# Patient Record
Sex: Male | Born: 1960 | Hispanic: No | Marital: Married | Smoking: Never smoker
Health system: Southern US, Community
[De-identification: ages and names within clinical notes are randomized; demographics above are authoritative.]

## PROBLEM LIST (undated history)

## (undated) DIAGNOSIS — D126 Benign neoplasm of colon, unspecified: Secondary | ICD-10-CM

## (undated) DIAGNOSIS — I10 Essential (primary) hypertension: Secondary | ICD-10-CM

## (undated) DIAGNOSIS — E785 Hyperlipidemia, unspecified: Secondary | ICD-10-CM

## (undated) DIAGNOSIS — T7840XA Allergy, unspecified, initial encounter: Secondary | ICD-10-CM

## (undated) DIAGNOSIS — K649 Unspecified hemorrhoids: Secondary | ICD-10-CM

## (undated) DIAGNOSIS — M109 Gout, unspecified: Secondary | ICD-10-CM

## (undated) HISTORY — DX: Gout, unspecified: M10.9

## (undated) HISTORY — DX: Hyperlipidemia, unspecified: E78.5

## (undated) HISTORY — DX: Benign neoplasm of colon, unspecified: D12.6

## (undated) HISTORY — DX: Unspecified hemorrhoids: K64.9

## (undated) HISTORY — DX: Essential (primary) hypertension: I10

## (undated) HISTORY — DX: Allergy, unspecified, initial encounter: T78.40XA

---

## 2001-04-09 ENCOUNTER — Encounter: Payer: Self-pay | Admitting: *Deleted

## 2001-04-09 ENCOUNTER — Encounter: Admission: RE | Admit: 2001-04-09 | Discharge: 2001-04-09 | Payer: Self-pay | Admitting: *Deleted

## 2011-08-22 ENCOUNTER — Other Ambulatory Visit (HOSPITAL_BASED_OUTPATIENT_CLINIC_OR_DEPARTMENT_OTHER): Payer: Self-pay | Admitting: Family Medicine

## 2011-08-22 DIAGNOSIS — I8390 Asymptomatic varicose veins of unspecified lower extremity: Secondary | ICD-10-CM

## 2011-08-23 ENCOUNTER — Ambulatory Visit (HOSPITAL_BASED_OUTPATIENT_CLINIC_OR_DEPARTMENT_OTHER)
Admission: RE | Admit: 2011-08-23 | Discharge: 2011-08-23 | Disposition: A | Discharge status: Home / Self Care | Payer: 59 | Source: Ambulatory Visit | Attending: Family Medicine | Admitting: Family Medicine

## 2011-08-23 DIAGNOSIS — L723 Sebaceous cyst: Secondary | ICD-10-CM | POA: Insufficient documentation

## 2011-08-23 DIAGNOSIS — I8390 Asymptomatic varicose veins of unspecified lower extremity: Secondary | ICD-10-CM | POA: Insufficient documentation

## 2011-09-27 ENCOUNTER — Encounter (INDEPENDENT_AMBULATORY_CARE_PROVIDER_SITE_OTHER): Payer: Self-pay | Admitting: Surgery

## 2011-10-18 ENCOUNTER — Encounter (INDEPENDENT_AMBULATORY_CARE_PROVIDER_SITE_OTHER): Payer: Self-pay | Admitting: Surgery

## 2011-10-18 ENCOUNTER — Ambulatory Visit (INDEPENDENT_AMBULATORY_CARE_PROVIDER_SITE_OTHER): Payer: 59 | Admitting: Surgery

## 2011-10-18 VITALS — BP 150/96 | HR 68 | Temp 97.2°F | Resp 18 | Ht 71.0 in | Wt 196.0 lb

## 2011-10-18 DIAGNOSIS — S8010XA Contusion of unspecified lower leg, initial encounter: Secondary | ICD-10-CM

## 2011-10-18 NOTE — Progress Notes (Signed)
Patient ID: Roy Padilla, male   DOB: May 05, 1960, 51 y.o.   MRN: [id number]  Chief Complaint  Patient presents with  . Mass    lower leg mass    HPI Roy Padilla is a 51 y.o. male.  Referred by Dr. 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 He presents with a two-year history of an enlarging mass on the posterior left leg just below the popliteal fossa. This was previously worked up with a Doppler which showed that this was not a vascular structure. This cyst was aspirated by Dr. Foy Guadalajara which yielded some thin bloody-appearing fluid. There was decompression of the cyst. Subsequently this has reaccumulated and is now about 2.5 cm x 1.5 cm. It causes minimal discomfort. There some concern because it seems to be getting larger. He presents now to discuss excision. Past Medical History  Diagnosis Date  . Hyperlipidemia   . Allergy   . Hemorrhoids   . Tubular adenoma of colon   . Hypertension   . Gout     No past surgical history on file.  No family history on file.  Social History History  Substance Use Topics  . Smoking status: Never Smoker   . Smokeless tobacco: Not on file  . Alcohol Use: Yes    No Known Allergies  Current Outpatient Prescriptions  Medication Sig Dispense Refill  . atenolol (TENORMIN) 50 MG tablet Take 50 mg by mouth daily.      Marland Kitchen atorvastatin (LIPITOR) 20 MG tablet Take 20 mg by mouth daily.      . fenofibrate 160 MG tablet Take 160 mg by mouth daily.      . meloxicam (MOBIC) 15 MG tablet Take 15 mg by mouth daily.      Marland Kitchen omega-3 acid ethyl esters (LOVAZA) 1 G capsule Take 2 g by mouth 2 (two) times daily.      Marland Kitchen omeprazole (PRILOSEC) 20 MG capsule Take 20 mg by mouth daily.        Review of Systems Review of Systems  Constitutional: Negative for fever, chills and unexpected weight change.  HENT: Negative for hearing loss, congestion, sore throat,  trouble swallowing and voice change.   Eyes: Negative for visual disturbance.  Respiratory: Negative for cough and wheezing.   Cardiovascular: Negative for chest pain, palpitations and leg swelling.  Gastrointestinal: Negative for nausea, vomiting, abdominal pain, diarrhea, constipation, blood in stool, abdominal distention, anal bleeding and rectal pain.  Genitourinary: Negative for hematuria and difficulty urinating.  Musculoskeletal: Negative for arthralgias.  Skin: Negative for rash and wound.  Neurological: Negative for seizures, syncope, weakness and headaches.  Hematological: Negative for adenopathy. Does not bruise/bleed easily.  Psychiatric/Behavioral: Negative for confusion.    Blood pressure 150/96, pulse 68, temperature 97.2 F (36.2 C), resp. rate 18, height 5\' 11"  (1.803 m), weight 196 lb (88.905 kg).  Physical Exam Physical Exam WDWN in NAD Left leg - no edema Posteriorly medial below popliteal fossa - protruding 2.5 x 1.5 cm cystic structure - mobile, non-tender, no inflammation Data Reviewed none  Assessment    Hematoma vs cyst - left leg    Plan    Excision in the office under local anesthetic.  The surgical procedure has been discussed with the patient.  Potential risks, benefits, alternative treatments, and expected outcomes have been explained.  All of the patient's questions at this time have  been answered.  The likelihood of reaching the patient's treatment goal is good.  The patient understand the proposed surgical procedure and wishes to proceed.        Nakari Bracknell K. 10/18/2011, 9:52 AM

## 2011-11-07 ENCOUNTER — Other Ambulatory Visit (INDEPENDENT_AMBULATORY_CARE_PROVIDER_SITE_OTHER): Payer: Self-pay | Admitting: Surgery

## 2011-11-07 ENCOUNTER — Encounter (INDEPENDENT_AMBULATORY_CARE_PROVIDER_SITE_OTHER): Payer: Self-pay | Admitting: Surgery

## 2011-11-07 ENCOUNTER — Ambulatory Visit (INDEPENDENT_AMBULATORY_CARE_PROVIDER_SITE_OTHER): Payer: 59 | Admitting: Surgery

## 2011-11-07 VITALS — BP 124/82 | HR 69 | Temp 98.3°F | Ht 71.0 in | Wt 203.6 lb

## 2011-11-07 DIAGNOSIS — R229 Localized swelling, mass and lump, unspecified: Secondary | ICD-10-CM

## 2011-11-07 DIAGNOSIS — S8010XA Contusion of unspecified lower leg, initial encounter: Secondary | ICD-10-CM

## 2011-11-07 NOTE — Progress Notes (Signed)
The patient presents for excision of the cyst on the back of his leg. It looks like it has gotten larger since last visit. It is dark colored, as if it was full old blood. We shaved this area and prepped with Betadine. Anesthetized with 1% lidocaine. We made a 2 cm incision over the mass. We dissected down around this mass. This appeared to be a blood-filled cyst. However it was not connected to any vascular structure. This cannot completely intact. We inspected for hemostasis. The wound was closed with 4-0 Monocryl. Steri-Strips and clean dressings were applied. The patient will followup on a when necessary basis. We will call him with the pathology result.  Wilmon Arms. Corliss Skains, MD, [facility]  11/07/2011 5:12 PM

## 2011-11-07 NOTE — Patient Instructions (Addendum)
Remove dressing on Saturday.  Shower over the steri-strips.  Ice pack as needed

## 2011-11-10 NOTE — Progress Notes (Signed)
Quick Note:  Please call the patient and let them know that their path showed a benign hemangioma (abnormal blood vessel). ______

## 2011-11-11 ENCOUNTER — Telehealth (INDEPENDENT_AMBULATORY_CARE_PROVIDER_SITE_OTHER): Payer: Self-pay | Admitting: General Surgery

## 2011-11-11 NOTE — Telephone Encounter (Signed)
Pt returned my call to give him his path report and I told pt if he needed [place] at any time to give [place] a call

## 2014-07-12 ENCOUNTER — Encounter (HOSPITAL_COMMUNITY)
Admission: EM | Disposition: A | Discharge status: Home / Self Care | Payer: Self-pay | Source: Home / Self Care | Attending: Emergency Medicine

## 2014-07-12 ENCOUNTER — Emergency Department (HOSPITAL_COMMUNITY): Payer: Worker's Compensation

## 2014-07-12 ENCOUNTER — Encounter (HOSPITAL_COMMUNITY): Payer: Self-pay

## 2014-07-12 ENCOUNTER — Emergency Department (HOSPITAL_COMMUNITY): Payer: Worker's Compensation | Admitting: Anesthesiology

## 2014-07-12 ENCOUNTER — Emergency Department (HOSPITAL_COMMUNITY)
Admission: EM | Admit: 2014-07-12 | Discharge: 2014-07-12 | Disposition: A | Discharge status: Home / Self Care | Payer: Worker's Compensation | Attending: Emergency Medicine | Admitting: Emergency Medicine

## 2014-07-12 DIAGNOSIS — Z86010 Personal history of colonic polyps: Secondary | ICD-10-CM | POA: Diagnosis not present

## 2014-07-12 DIAGNOSIS — Y929 Unspecified place or not applicable: Secondary | ICD-10-CM | POA: Diagnosis not present

## 2014-07-12 DIAGNOSIS — E785 Hyperlipidemia, unspecified: Secondary | ICD-10-CM | POA: Diagnosis not present

## 2014-07-12 DIAGNOSIS — M109 Gout, unspecified: Secondary | ICD-10-CM | POA: Insufficient documentation

## 2014-07-12 DIAGNOSIS — X58XXXA Exposure to other specified factors, initial encounter: Secondary | ICD-10-CM | POA: Diagnosis not present

## 2014-07-12 DIAGNOSIS — I10 Essential (primary) hypertension: Secondary | ICD-10-CM | POA: Diagnosis not present

## 2014-07-12 DIAGNOSIS — S62617A Displaced fracture of proximal phalanx of left little finger, initial encounter for closed fracture: Secondary | ICD-10-CM | POA: Diagnosis present

## 2014-07-12 DIAGNOSIS — K219 Gastro-esophageal reflux disease without esophagitis: Secondary | ICD-10-CM | POA: Diagnosis not present

## 2014-07-12 DIAGNOSIS — Z79899 Other long term (current) drug therapy: Secondary | ICD-10-CM | POA: Diagnosis not present

## 2014-07-12 DIAGNOSIS — Z01811 Encounter for preprocedural respiratory examination: Secondary | ICD-10-CM

## 2014-07-12 HISTORY — PX: OPEN REDUCTION INTERNAL FIXATION (ORIF) HAND: SHX5991

## 2014-07-12 LAB — BASIC METABOLIC PANEL
Anion gap: 15 (ref 5–15)
BUN: 17 mg/dL (ref 6–20)
CALCIUM: 9.5 mg/dL (ref 8.9–10.3)
CO2: 21 mmol/L — ABNORMAL LOW (ref 22–32)
Chloride: 100 mmol/L — ABNORMAL LOW (ref 101–111)
Creatinine, Ser: 1.11 mg/dL (ref 0.61–1.24)
GLUCOSE: 102 mg/dL — AB (ref 65–99)
POTASSIUM: 3.6 mmol/L (ref 3.5–5.1)
SODIUM: 136 mmol/L (ref 135–145)

## 2014-07-12 LAB — CBC WITH DIFFERENTIAL/PLATELET
BASOS ABS: 0 10*3/uL (ref 0.0–0.1)
BASOS PCT: 1 % (ref 0–1)
Eosinophils Absolute: 0.1 10*3/uL (ref 0.0–0.7)
Eosinophils Relative: 1 % (ref 0–5)
HCT: 40.7 % (ref 39.0–52.0)
Hemoglobin: 13.9 g/dL (ref 13.0–17.0)
Lymphocytes Relative: 19 % (ref 12–46)
Lymphs Abs: 1.6 10*3/uL (ref 0.7–4.0)
MCH: 31.3 pg (ref 26.0–34.0)
MCHC: 34.2 g/dL (ref 30.0–36.0)
MCV: 91.7 fL (ref 78.0–100.0)
Monocytes Absolute: 0.8 10*3/uL (ref 0.1–1.0)
Monocytes Relative: 9 % (ref 3–12)
NEUTROS PCT: 70 % (ref 43–77)
Neutro Abs: 6.3 10*3/uL (ref 1.7–7.7)
Platelets: 291 10*3/uL (ref 150–400)
RBC: 4.44 MIL/uL (ref 4.22–5.81)
RDW: 12.7 % (ref 11.5–15.5)
WBC: 8.9 10*3/uL (ref 4.0–10.5)

## 2014-07-12 SURGERY — OPEN REDUCTION INTERNAL FIXATION (ORIF) HAND
Anesthesia: General | Site: Finger | Laterality: Left

## 2014-07-12 MED ORDER — MIDAZOLAM HCL 5 MG/5ML IJ SOLN
INTRAMUSCULAR | Status: DC | PRN
  Administered 2014-07-12: 2 mg via INTRAVENOUS

## 2014-07-12 MED ORDER — MIDAZOLAM HCL 2 MG/2ML IJ SOLN
INTRAMUSCULAR | Status: AC
  Filled 2014-07-12: qty 2

## 2014-07-12 MED ORDER — PROPOFOL 10 MG/ML IV BOLUS
INTRAVENOUS | Status: AC
  Filled 2014-07-12: qty 20

## 2014-07-12 MED ORDER — LIDOCAINE HCL (PF) 1 % IJ SOLN
20.0000 mL | Freq: Once | INTRAMUSCULAR | Status: DC
  Filled 2014-07-12: qty 20

## 2014-07-12 MED ORDER — ONDANSETRON HCL 4 MG/2ML IJ SOLN
INTRAMUSCULAR | Status: AC
  Filled 2014-07-12: qty 2

## 2014-07-12 MED ORDER — OXYCODONE HCL 5 MG PO TABS: 5.0000 mg | ORAL_TABLET | ORAL | Status: AC | PRN

## 2014-07-12 MED ORDER — LIDOCAINE HCL (CARDIAC) 20 MG/ML IV SOLN
INTRAVENOUS | Status: AC
  Filled 2014-07-12: qty 5

## 2014-07-12 MED ORDER — PROPOFOL 10 MG/ML IV BOLUS
INTRAVENOUS | Status: DC | PRN
  Administered 2014-07-12: 150 mg via INTRAVENOUS

## 2014-07-12 MED ORDER — FENTANYL CITRATE (PF) 100 MCG/2ML IJ SOLN
INTRAMUSCULAR | Status: DC | PRN
  Administered 2014-07-12: 100 ug via INTRAVENOUS
  Administered 2014-07-12 (×3): 50 ug via INTRAVENOUS

## 2014-07-12 MED ORDER — LIDOCAINE HCL (CARDIAC) 20 MG/ML IV SOLN
INTRAVENOUS | Status: DC | PRN
  Administered 2014-07-12: 50 mg via INTRAVENOUS

## 2014-07-12 MED ORDER — SODIUM CHLORIDE 0.9 % IV BOLUS (SEPSIS)
1000.0000 mL | Freq: Once | INTRAVENOUS | Status: AC
  Administered 2014-07-12: 1000 mL via INTRAVENOUS

## 2014-07-12 MED ORDER — HYDROMORPHONE HCL 1 MG/ML IJ SOLN
INTRAMUSCULAR | Status: AC
  Filled 2014-07-12: qty 1

## 2014-07-12 MED ORDER — CEFAZOLIN SODIUM-DEXTROSE 2-3 GM-% IV SOLR
INTRAVENOUS | Status: AC
  Filled 2014-07-12: qty 50

## 2014-07-12 MED ORDER — CEFAZOLIN SODIUM-DEXTROSE 2-3 GM-% IV SOLR
INTRAVENOUS | Status: DC | PRN
  Administered 2014-07-12: 2 g via INTRAVENOUS

## 2014-07-12 MED ORDER — ONDANSETRON HCL 4 MG/2ML IJ SOLN
4.0000 mg | Freq: Once | INTRAMUSCULAR | Status: AC
  Administered 2014-07-12: 4 mg via INTRAVENOUS
  Filled 2014-07-12: qty 2

## 2014-07-12 MED ORDER — DEXAMETHASONE SODIUM PHOSPHATE 10 MG/ML IJ SOLN
INTRAMUSCULAR | Status: AC
  Filled 2014-07-12: qty 1

## 2014-07-12 MED ORDER — BUPIVACAINE HCL (PF) 0.25 % IJ SOLN
INTRAMUSCULAR | Status: AC
  Filled 2014-07-12: qty 30

## 2014-07-12 MED ORDER — LACTATED RINGERS IV SOLN: INTRAVENOUS | Status: DC

## 2014-07-12 MED ORDER — FENTANYL CITRATE (PF) 100 MCG/2ML IJ SOLN: 25.0000 ug | INTRAMUSCULAR | Status: DC | PRN

## 2014-07-12 MED ORDER — HYDROMORPHONE HCL 1 MG/ML IJ SOLN
0.2500 mg | INTRAMUSCULAR | Status: DC | PRN
  Administered 2014-07-12 (×2): 0.5 mg via INTRAVENOUS

## 2014-07-12 MED ORDER — CEPHALEXIN 500 MG PO CAPS: 500.0000 mg | ORAL_CAPSULE | Freq: Four times a day (QID) | ORAL | Status: AC

## 2014-07-12 MED ORDER — PROMETHAZINE HCL 25 MG/ML IJ SOLN: 6.2500 mg | INTRAMUSCULAR | Status: DC | PRN

## 2014-07-12 MED ORDER — FENTANYL CITRATE (PF) 250 MCG/5ML IJ SOLN
INTRAMUSCULAR | Status: AC
  Filled 2014-07-12: qty 5

## 2014-07-12 MED ORDER — SUCCINYLCHOLINE CHLORIDE 20 MG/ML IJ SOLN
INTRAMUSCULAR | Status: DC | PRN
  Administered 2014-07-12: 80 mg via INTRAVENOUS

## 2014-07-12 MED ORDER — ONDANSETRON HCL 4 MG/2ML IJ SOLN
INTRAMUSCULAR | Status: DC | PRN
  Administered 2014-07-12: 4 mg via INTRAVENOUS

## 2014-07-12 MED ORDER — BUPIVACAINE HCL (PF) 0.25 % IJ SOLN
INTRAMUSCULAR | Status: DC | PRN
  Administered 2014-07-12: 10 mL

## 2014-07-12 MED ORDER — MORPHINE SULFATE 4 MG/ML IJ SOLN
4.0000 mg | Freq: Once | INTRAMUSCULAR | Status: AC
  Administered 2014-07-12: 4 mg via INTRAVENOUS
  Filled 2014-07-12: qty 1

## 2014-07-12 MED ORDER — LACTATED RINGERS IV SOLN
INTRAVENOUS | Status: DC | PRN
  Administered 2014-07-12: 20:00:00 via INTRAVENOUS

## 2014-07-12 SURGICAL SUPPLY — 46 items
BAG SPEC THK2 15X12 ZIP CLS (MISCELLANEOUS) ×1
BAG ZIPLOCK 12X15 (MISCELLANEOUS) ×3 IMPLANT
BNDG CONFORM 4 STRL LF (GAUZE/BANDAGES/DRESSINGS) ×2 IMPLANT
BNDG GAUZE ELAST 4 BULKY (GAUZE/BANDAGES/DRESSINGS) ×3 IMPLANT
CUFF TOURN SGL QUICK 18 (TOURNIQUET CUFF) ×3 IMPLANT
DECANTER SPIKE VIAL GLASS SM (MISCELLANEOUS) ×3 IMPLANT
DRAPE OEC MINIVIEW 54X84 (DRAPES) ×3 IMPLANT
DRAPE U-SHAPE 47X51 STRL (DRAPES) ×1 IMPLANT
DRSG PAD ABDOMINAL 8X10 ST (GAUZE/BANDAGES/DRESSINGS) ×1 IMPLANT
ELECT REM PT RETURN 9FT ADLT (ELECTROSURGICAL)
ELECTRODE REM PT RTRN 9FT ADLT (ELECTROSURGICAL) ×1 IMPLANT
EVACUATOR 1/8 PVC DRAIN (DRAIN) ×1 IMPLANT
GAUZE SPONGE 4X4 12PLY STRL (GAUZE/BANDAGES/DRESSINGS) ×3 IMPLANT
GAUZE SPONGE 4X4 16PLY XRAY LF (GAUZE/BANDAGES/DRESSINGS) ×6 IMPLANT
GAUZE XEROFORM 1X8 LF (GAUZE/BANDAGES/DRESSINGS) ×2 IMPLANT
GLOVE BIO SURGEON STRL SZ8 (GLOVE) ×3 IMPLANT
GOWN STRL REUS W/TWL XL LVL3 (GOWN DISPOSABLE) ×3 IMPLANT
KIT BASIN OR (CUSTOM PROCEDURE TRAY) ×3 IMPLANT
KWIRE 4.0 X .045IN (WIRE) ×6 IMPLANT
KWIRE 4.0 X .062IN (WIRE) ×2 IMPLANT
MANIFOLD NEPTUNE II (INSTRUMENTS) ×3 IMPLANT
NS IRRIG 1000ML POUR BTL (IV SOLUTION) ×3 IMPLANT
PACK ORTHO EXTREMITY (CUSTOM PROCEDURE TRAY) ×3 IMPLANT
PAD CAST 3X4 CTTN HI CHSV (CAST SUPPLIES) ×1 IMPLANT
PAD CAST 4YDX4 CTTN HI CHSV (CAST SUPPLIES) ×1 IMPLANT
PADDING CAST COTTON 3X4 STRL (CAST SUPPLIES) ×3
PADDING CAST COTTON 4X4 STRL (CAST SUPPLIES)
POSITIONER SURGICAL ARM (MISCELLANEOUS) ×3 IMPLANT
SPLINT PLASTER EXTRA FAST 3X15 (CAST SUPPLIES) ×2
SPLINT PLASTER GYPS XFAST 3X15 (CAST SUPPLIES) IMPLANT
SUT BONE WAX W31G (SUTURE) ×3 IMPLANT
SUT ETHILON 6 0 PS 3 18 (SUTURE) ×1 IMPLANT
SUT MERSILENE 4 0 P 3 (SUTURE) ×1 IMPLANT
SUT MNCRL AB 4-0 PS2 18 (SUTURE) ×1 IMPLANT
SUT PROLENE 3 0 PS 2 (SUTURE) ×1 IMPLANT
SUT PROLENE 4 0 P 3 18 (SUTURE) ×1 IMPLANT
SUT PROLENE 4 0 RB 1 (SUTURE)
SUT PROLENE 4-0 RB1 .5 CRCL 36 (SUTURE) ×1 IMPLANT
SUT VIC AB 0 CT1 27 (SUTURE)
SUT VIC AB 0 CT1 27XBRD ANTBC (SUTURE) ×2 IMPLANT
SUT VIC AB 2-0 CT1 27 (SUTURE)
SUT VIC AB 2-0 CT1 TAPERPNT 27 (SUTURE) ×1 IMPLANT
SYR CONTROL 10ML LL (SYRINGE) ×2 IMPLANT
TOWEL OR 17X26 10 PK STRL BLUE (TOWEL DISPOSABLE) ×3 IMPLANT
UNDERPAD 30X30 INCONTINENT (UNDERPADS AND DIAPERS) ×3 IMPLANT
WATER STERILE IRR 1500ML POUR (IV SOLUTION) ×3 IMPLANT

## 2014-07-12 NOTE — Discharge Instructions (Signed)
.  Keep bandage clean and dry.  Call for any problems.  No smoking.  Criteria for driving a car: you should be off your pain medicine for 7-8 hours, able to drive one handed(confident), thinking clearly and feeling able in your judgement to drive. Continue elevation as it will decrease swelling.  If instructed by MD move your fingers within the confines of the bandage/splint.  Use ice if instructed by your MD. Call immediately for any sudden loss of feeling in your hand/arm or change in functional abilities of the extremity. .We recommend that you to take vitamin C 1000 mg a day to promote healing. We also recommend that if you require  pain medicine that you take a stool softener to prevent constipation as most pain medicines will have constipation side effects. We recommend either Peri-Colace or Senokot and recommend that you also consider adding MiraLAX to prevent the constipation affects from pain medicine if you are required to use them. These medicines are over the counter and maybe purchased at a local pharmacy. A cup of yogurt and a probiotic can also be helpful during the recovery process as the medicines can disrupt your intestinal environment.

## 2014-07-12 NOTE — Transfer of Care (Signed)
Immediate Anesthesia Transfer of Care Note  Patient: Roy Padilla  Procedure(s) Performed: Procedure(s):  closed reduction of left hand small finger fracture and pinning (Left)  Patient Location: PACU  Anesthesia Type:General  Level of Consciousness:  sedated, patient cooperative and responds to stimulation  Airway & Oxygen Therapy:Patient Spontanous Breathing and Patient connected to face mask oxgen  Post-op Assessment:  Report given to PACU RN and Post -op Vital signs reviewed and stable  Post vital signs:  Reviewed and stable  Last Vitals:  Filed Vitals:   07/12/14 1948  BP: 142/83  Pulse: 79  Temp:   Resp: 16    Complications: No apparent anesthesia complications

## 2014-07-12 NOTE — ED Notes (Signed)
John, ortho tech at bedside applying splint.
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
## 2014-07-12 NOTE — Anesthesia Procedure Notes (Signed)
Procedure Name: Intubation Date/Time: 07/12/2014 8:56 PM Performed by: Anne Fu Pre-anesthesia Checklist: Patient identified, Emergency Drugs available, Suction available, Patient being monitored and Timeout performed Patient Re-evaluated:Patient Re-evaluated prior to inductionOxygen Delivery Method: Circle system utilized Preoxygenation: Pre-oxygenation with 100% oxygen Intubation Type: IV induction Ventilation: Mask ventilation without difficulty Laryngoscope Size: Mac and 4 Grade View: Grade I Tube type: Oral Tube size: 7.5 mm Number of attempts: 1 Airway Equipment and Method: Stylet Placement Confirmation: ETT inserted through vocal cords under direct vision,  positive ETCO2,  CO2 detector and breath sounds checked- equal and bilateral Secured at: 22 cm Tube secured with: Tape Dental Injury: Teeth and Oropharynx as per pre-operative assessment

## 2014-07-12 NOTE — ED Notes (Signed)
Per Diane in [place], patient to be picked up in @ 30 minutes. Patient and family updated.

## 2014-07-12 NOTE — Anesthesia Preprocedure Evaluation (Signed)
Anesthesia Evaluation  Patient identified by MRN, date of birth, ID band Patient awake    Reviewed: Allergy & Precautions, NPO status , Patient's Chart, lab work & pertinent test results  Airway Mallampati: II  TM Distance: >3 FB Neck ROM: Full    Dental no notable dental hx.    Pulmonary neg pulmonary ROS,  breath sounds clear to auscultation  Pulmonary exam normal       Cardiovascular Exercise Tolerance: Good hypertension, Pt. on medications and Pt. on home beta blockers Normal cardiovascular examRhythm:Regular Rate:Normal     Neuro/Psych negative neurological ROS  negative psych ROS   GI/Hepatic negative GI ROS, Neg liver ROS,   Endo/Other  negative endocrine ROS  Renal/GU negative Renal ROS  negative genitourinary   Musculoskeletal negative musculoskeletal ROS (+)   Abdominal   Peds negative pediatric ROS (+)  Hematology negative hematology ROS (+)   Anesthesia Other Findings   Reproductive/Obstetrics negative OB ROS                             Anesthesia Physical Anesthesia Plan  ASA: II  Anesthesia Plan: General   Post-op Pain Management:    Induction: Intravenous  Airway Management Planned: Oral ETT  Additional Equipment:   Intra-op Plan:   Post-operative Plan: Extubation in OR  Informed Consent: I have reviewed the patients History and Physical, chart, labs and discussed the procedure including the risks, benefits and alternatives for the proposed anesthesia with the patient or authorized representative who has indicated his/her understanding and acceptance.   Dental advisory given  Plan Discussed with: CRNA  Anesthesia Plan Comments:         Anesthesia Quick Evaluation

## 2014-07-12 NOTE — Op Note (Signed)
See dictation#[id number] Amedeo Plenty MD

## 2014-07-12 NOTE — Anesthesia Postprocedure Evaluation (Signed)
  Anesthesia Post-op Note  Patient: Roy Padilla  Procedure(s) Performed: Procedure(s):  closed reduction of left hand small finger fracture and pinning (Left)  Patient Location: PACU  Anesthesia Type:General  Level of Consciousness: awake, alert  and oriented  Airway and Oxygen Therapy: Patient Spontanous Breathing  Post-op Pain: 1 /10  Post-op Assessment: Post-op Vital signs reviewed  Post-op Vital Signs: Reviewed and stable  Last Vitals:  Filed Vitals:   07/12/14 2215  BP: 142/95  Pulse: 82  Temp:   Resp: 15    Complications: No apparent anesthesia complications

## 2014-07-13 ENCOUNTER — Encounter (HOSPITAL_COMMUNITY): Payer: Self-pay | Admitting: Orthopedic Surgery

## 2014-07-13 NOTE — Op Note (Signed)
NAMEKEJON, FEILD NO.:  [id number]  MEDICAL RECORD NO.:  [id number]  LOCATION:  [facility]                         FACILITY:  [facility]  PHYSICIAN:  Satira Anis. Kynslei Art, M.D.DATE OF BIRTH:  May 26, 1960  DATE OF PROCEDURE: DATE OF DISCHARGE:                              OPERATIVE REPORT   PREOPERATIVE DIAGNOSIS:  Of note, displaced small finger proximal phalanx fracture, comminuted at the metacarpophalangeal joint, left small finger.  POSTOPERATIVE DIAGNOSES:  Of note, displaced small finger proximal phalanx fracture, comminuted at the metacarpophalangeal joint, left small finger with noted radial collateral ligament injury about the proximal interphalangeal joint.  SURGICAL PROCEDURES: 1. Closed reduction and Eaton-Belsky pinning, left small finger     proximal phalanx, comminuted complex fracture in the     metacarpophalangeal joint region intra-articular in nature. 2. AP, lateral, and oblique stress radiography performed, examined and     interpreted by myself. 3. Closed treatment of radial collateral ligament injury, proximal     interphalangeal joint, left small finger.  SURGEON:  Satira Anis. Amedeo Plenty, M.D.  ASSISTANT:  Avelina Laine, P.A.-C.  COMPLICATION:  None.  ANESTHESIA:  General.  TOURNIQUET TIME:  Zero.  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  He understands risks and benefits of surgery and desires to proceed with the above-mentioned operative intervention.  OPERATION IN DETAIL:  The patient was seen by myself and Anesthesia, taken to the operating suite and underwent smooth induction of general anesthesia, laid supine.  He was given the Hibiclens, prescrubbed by myself followed by Betadine scrub and paint followed by sterile field application.  Time-out was called.  Preoperative antibiotics were given.  The patient underwent manipulative reduction of a very comminuted complex intra- articular fracture  and an Eaton-Belsky pinning technique was employed. This allowed two 0.045 K-wires to engage the proximal phalanx, allowing MCP and PIP range of motion to be maintained.  The PIP joint was evaluated, noted to have radial collateral ligament injury.  There were no complicating features.  The patient tolerated the procedure well.  The patient had full range of motion, no complicating issues.  AP, lateral, and oblique x-rays were performed, examined, and interpreted by myself, and looked to be adequate.  He had a large amount of comminution; however, did not feel that an ORIF with an open approach would benefit him highly given the crumbled nature of the bony architecture.  I felt that the distraction pinning and reasonable alignment would allow him the best outcome.  He does have a higher propensity than normal population towards arthritis after a severe injury of this magnitude and I discussed this with his wife.  X-rays were taken, final copies performed and there were no complicating features.  Pins were bent on themselves and clipped.  Xeroform and a splint were applied.  Small amount of Sensorcaine with epinephrine was placed for postop analgesia.  The patient tolerated the procedure well. There were no complicating features.  All sponge, needle, and instrument counts were reported as correct.  We will plan for early range of motion with the Eaton-Belsky technique and monitor the patient's condition closely.  Satira Anis. Amedeo Plenty, M.D.     [facility]  D:  07/12/2014  T:  07/13/2014  Job:  [id number]

## 2014-07-20 ENCOUNTER — Encounter (HOSPITAL_COMMUNITY): Payer: Self-pay | Admitting: Orthopedic Surgery

## 2016-10-12 IMAGING — CR DG HAND COMPLETE 3+V*L*
3 series · 3 of 3 positions shown · non-contrast
Comparison: None.

CLINICAL DATA: 53-year-old male status post blunt trauma with left
fifth finger pain. Initial encounter.

EXAM:
LEFT HAND - COMPLETE 3+ VIEW

[x hand pa left]
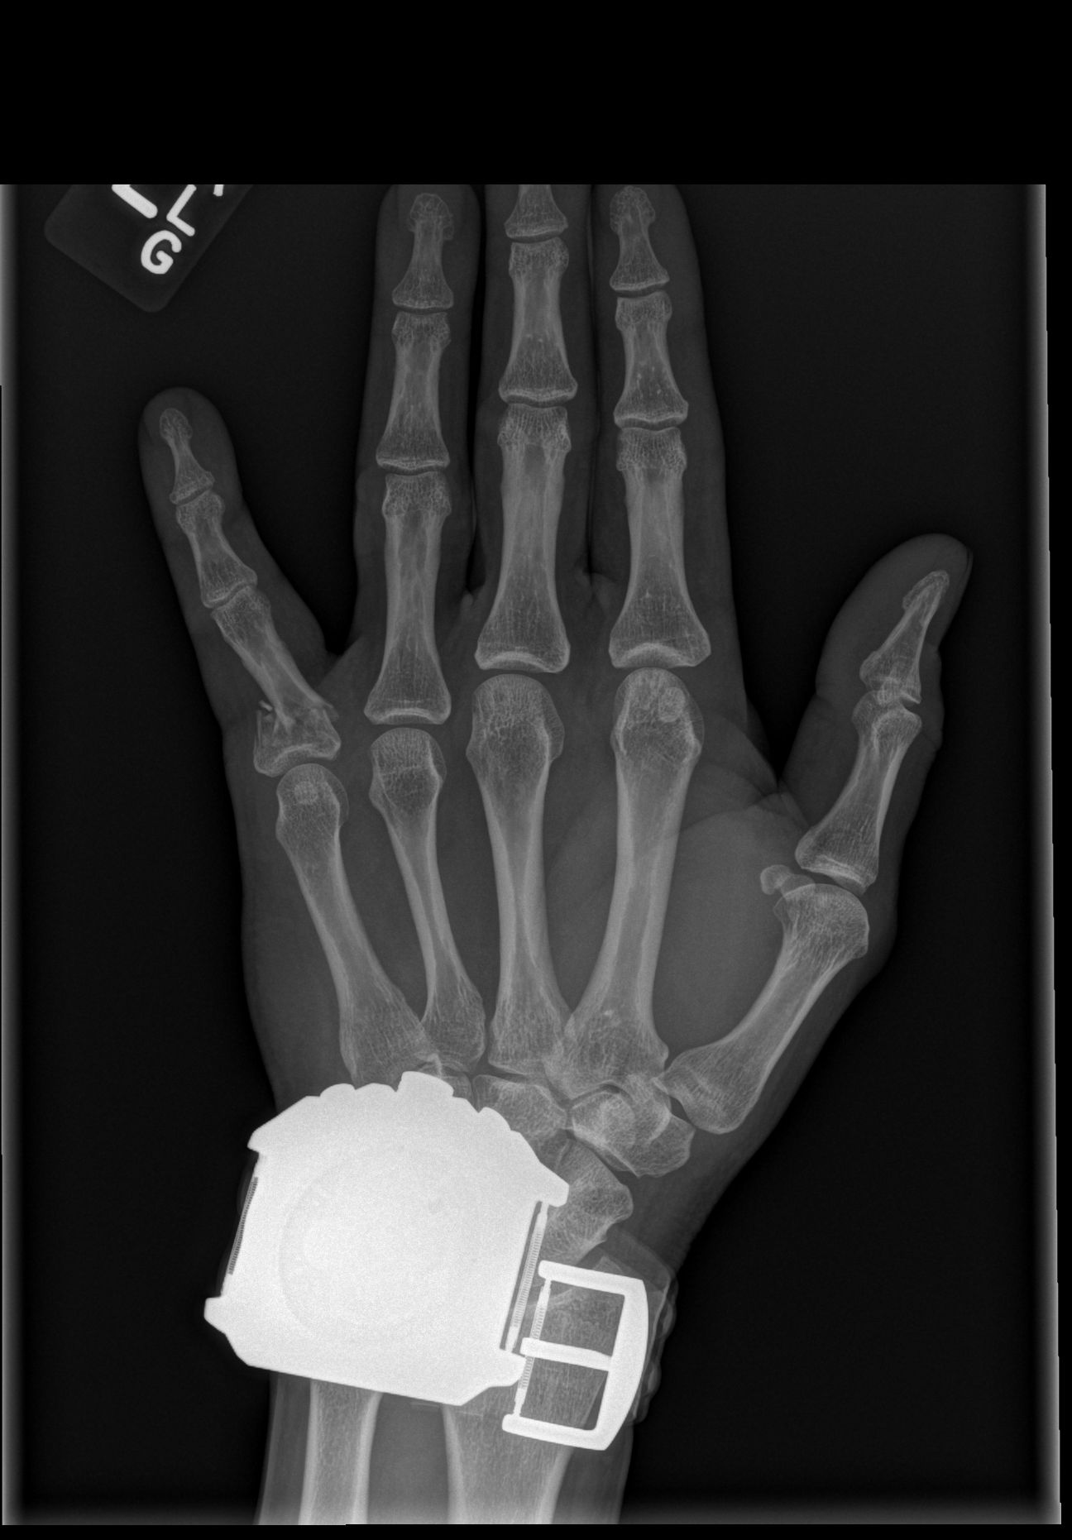

[x hand obl left]
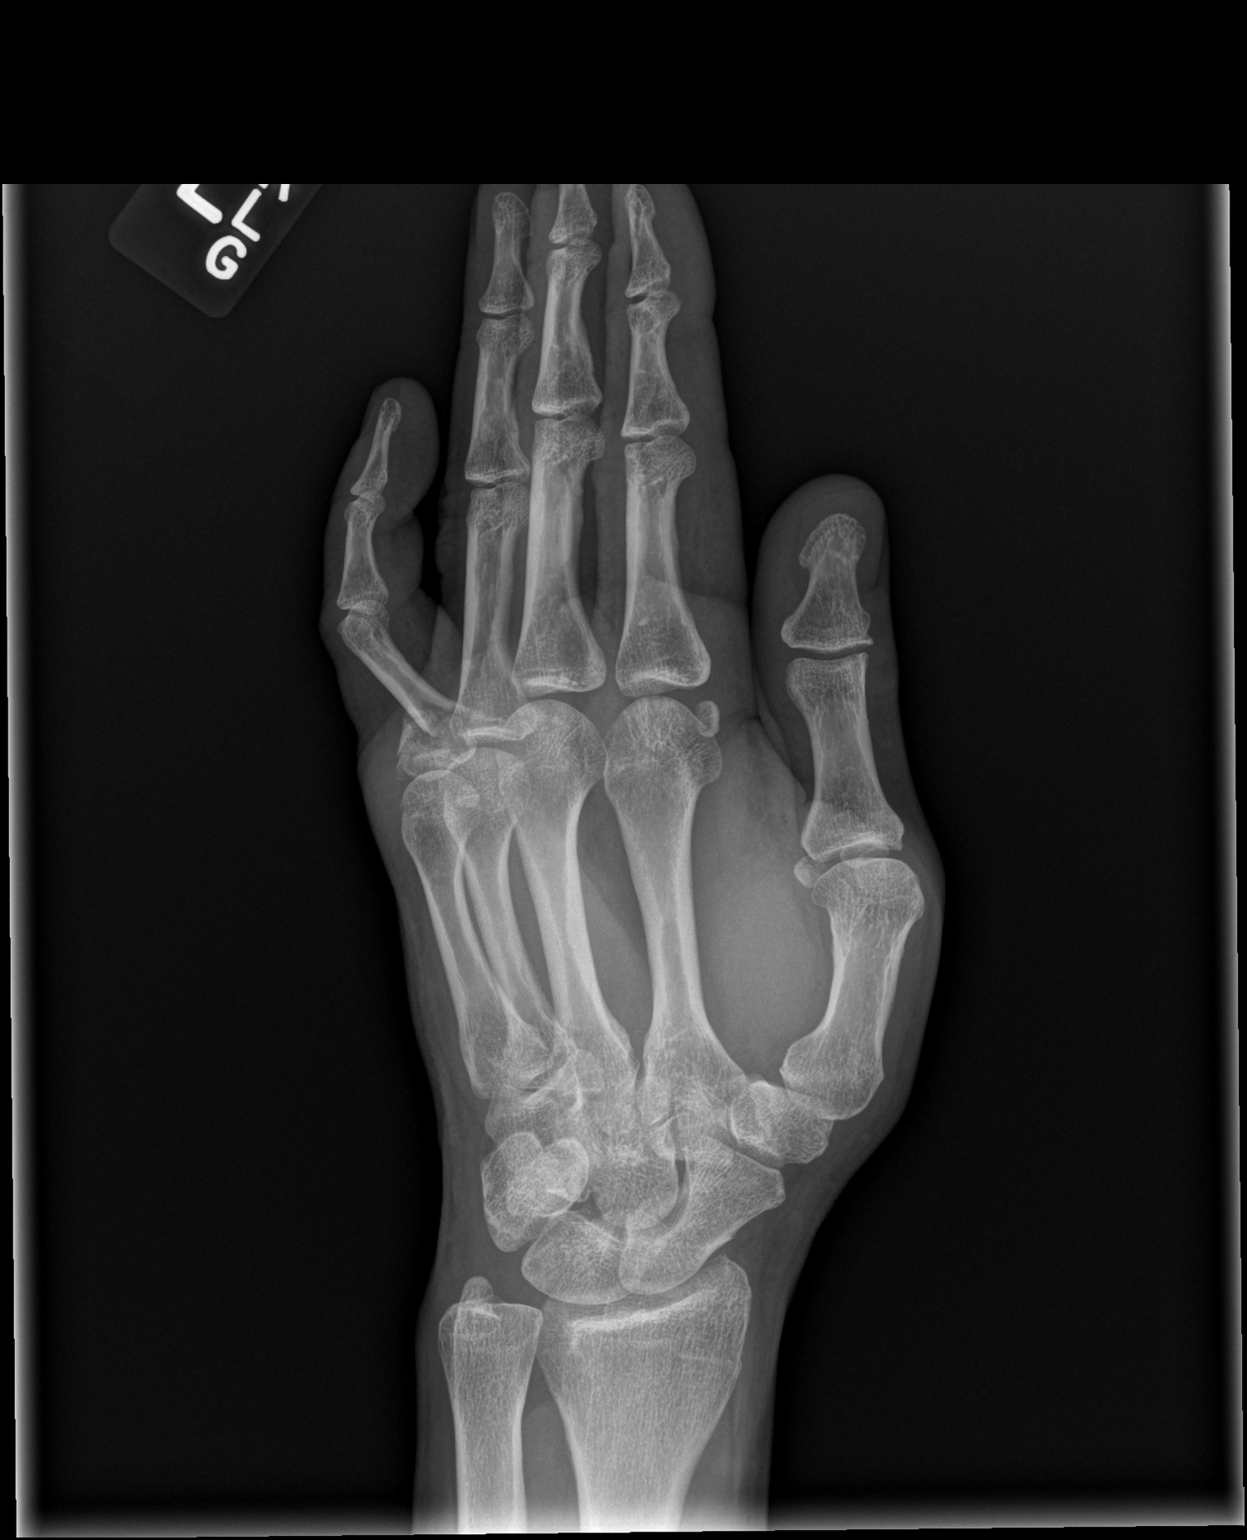

[x hand lat left]
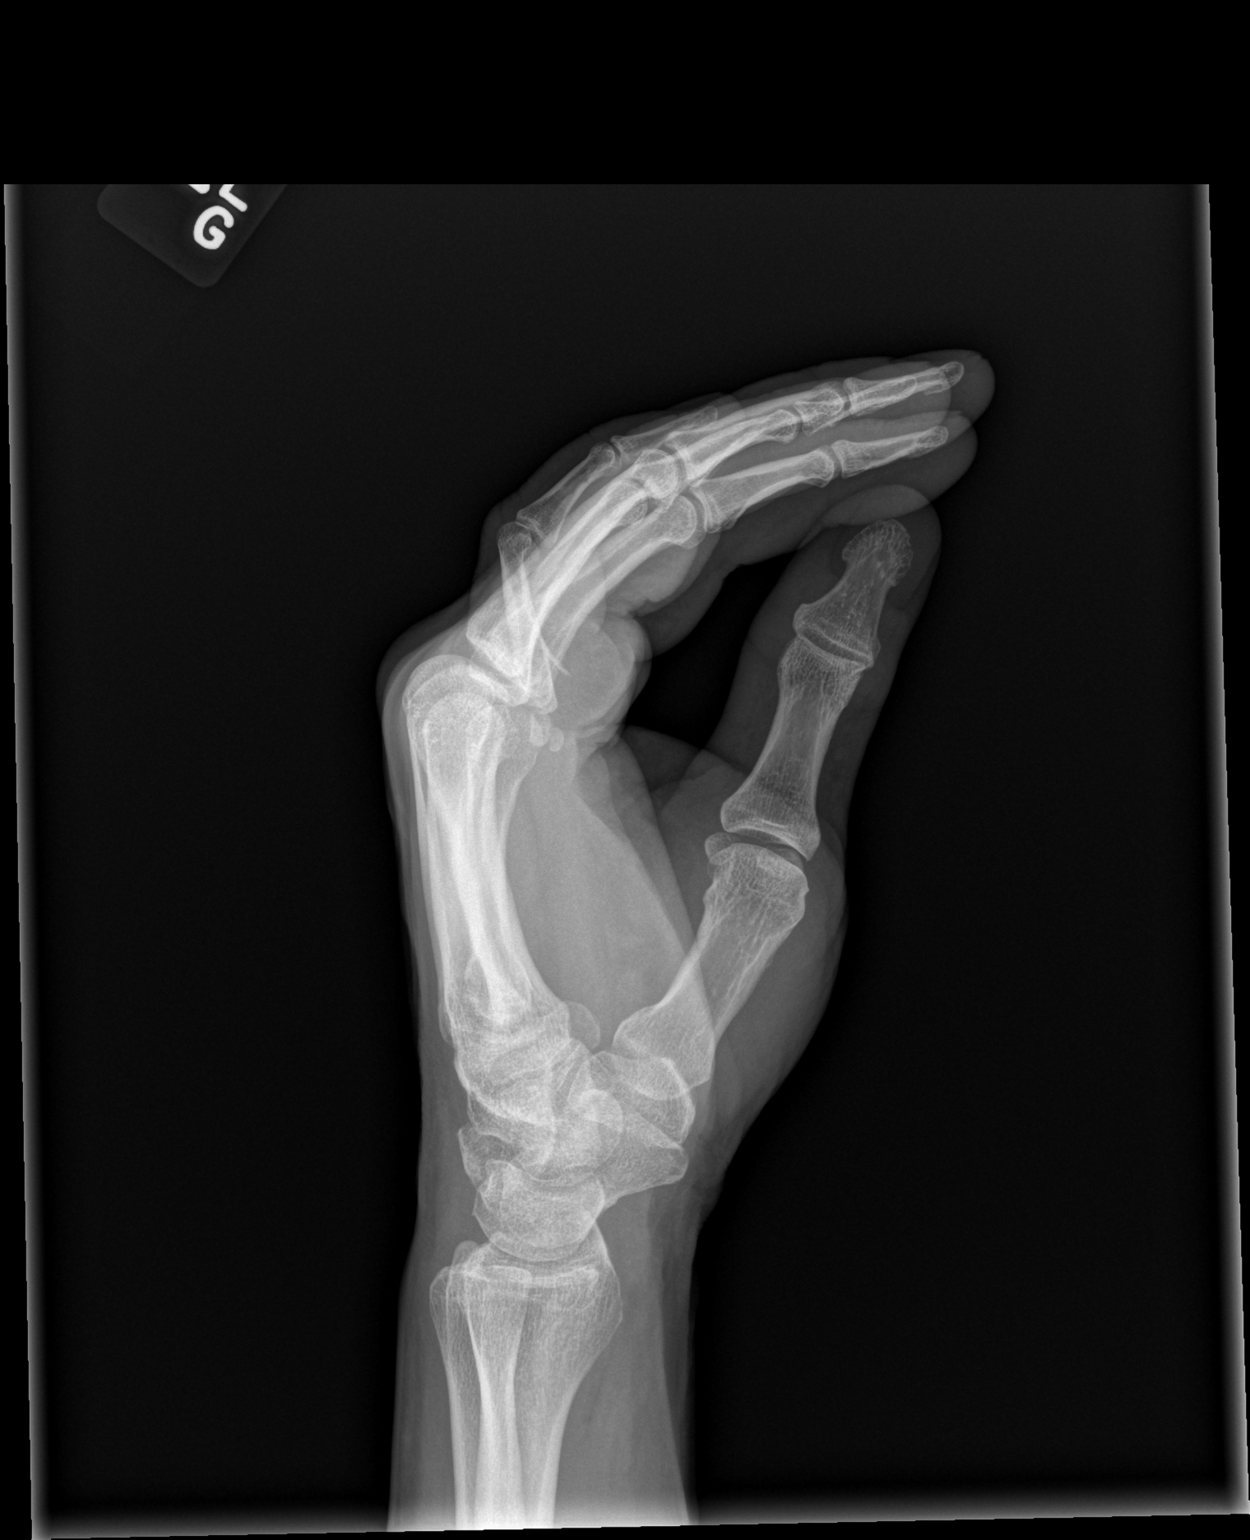

[3 of 3 positions shown; findings below may reference images not displayed]

FINDINGS: Highly comminuted fracture through the base of the left fifth
proximal phalanx. The fracture does appear to extend to the dorsal
fifth MCP joint (image 2). [DATE] shaft with radial and volar
displacement with ulnar and dorsal angulation of the distal
fragment.

The fifth metacarpal appears intact. Other phalanges are intact.
Wrist watch artifact over the wrist on the PA view. Carpal bone
alignment and distal radius and ulna appear grossly intact.
IMPRESSION: Comminuted, displaced and angulated fracture at the base of the left
fifth proximal phalanx. Suspect this fracture does have an
intra-articular component.

## 2016-10-12 IMAGING — CR DG CHEST 2V
2 series · 2 of 2 positions shown · non-contrast
Comparison: None.

CLINICAL DATA: 53-year-old male with a history of preoperative
chest x-ray.

EXAM:
CHEST - 2 VIEW

[w chest pa]
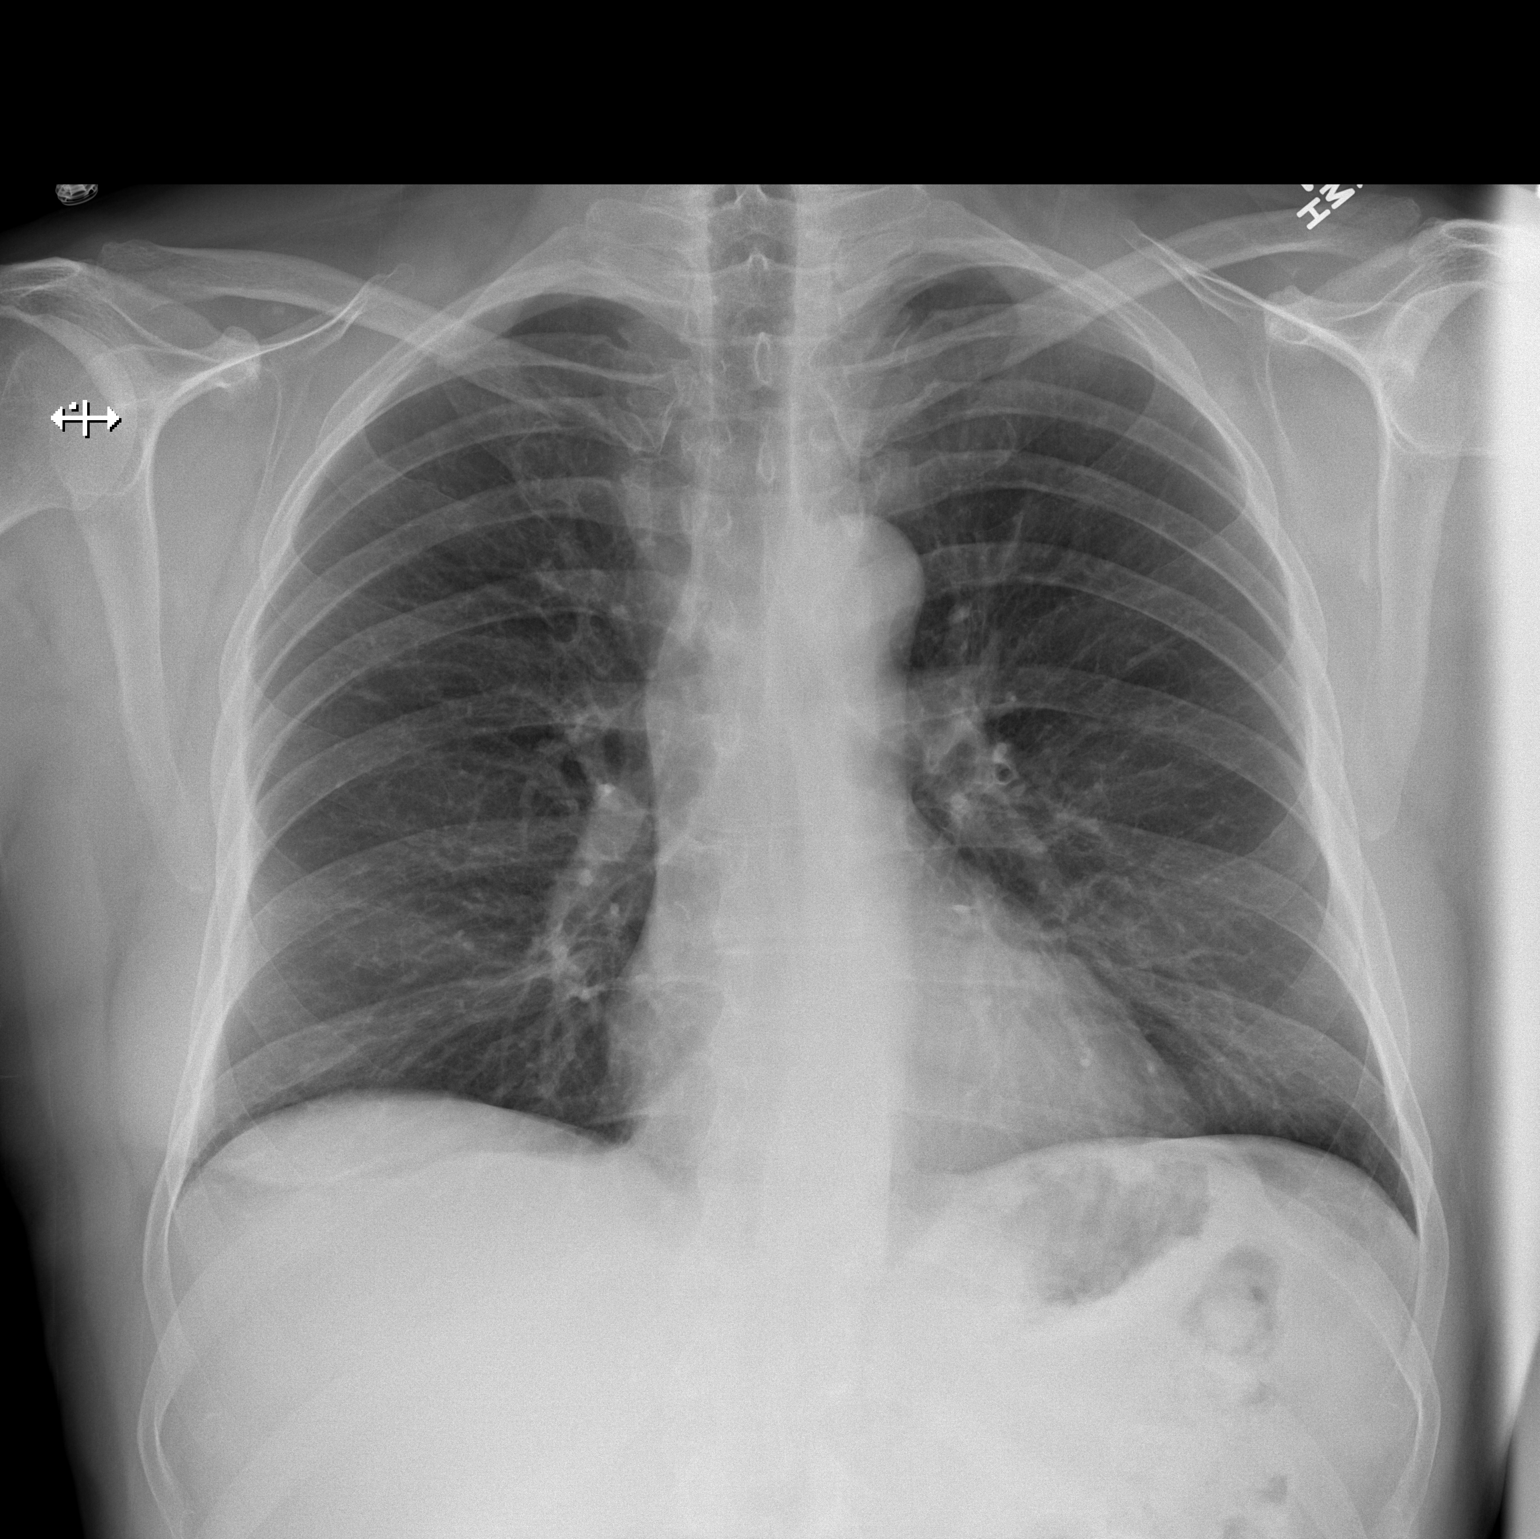

[w chest lat]
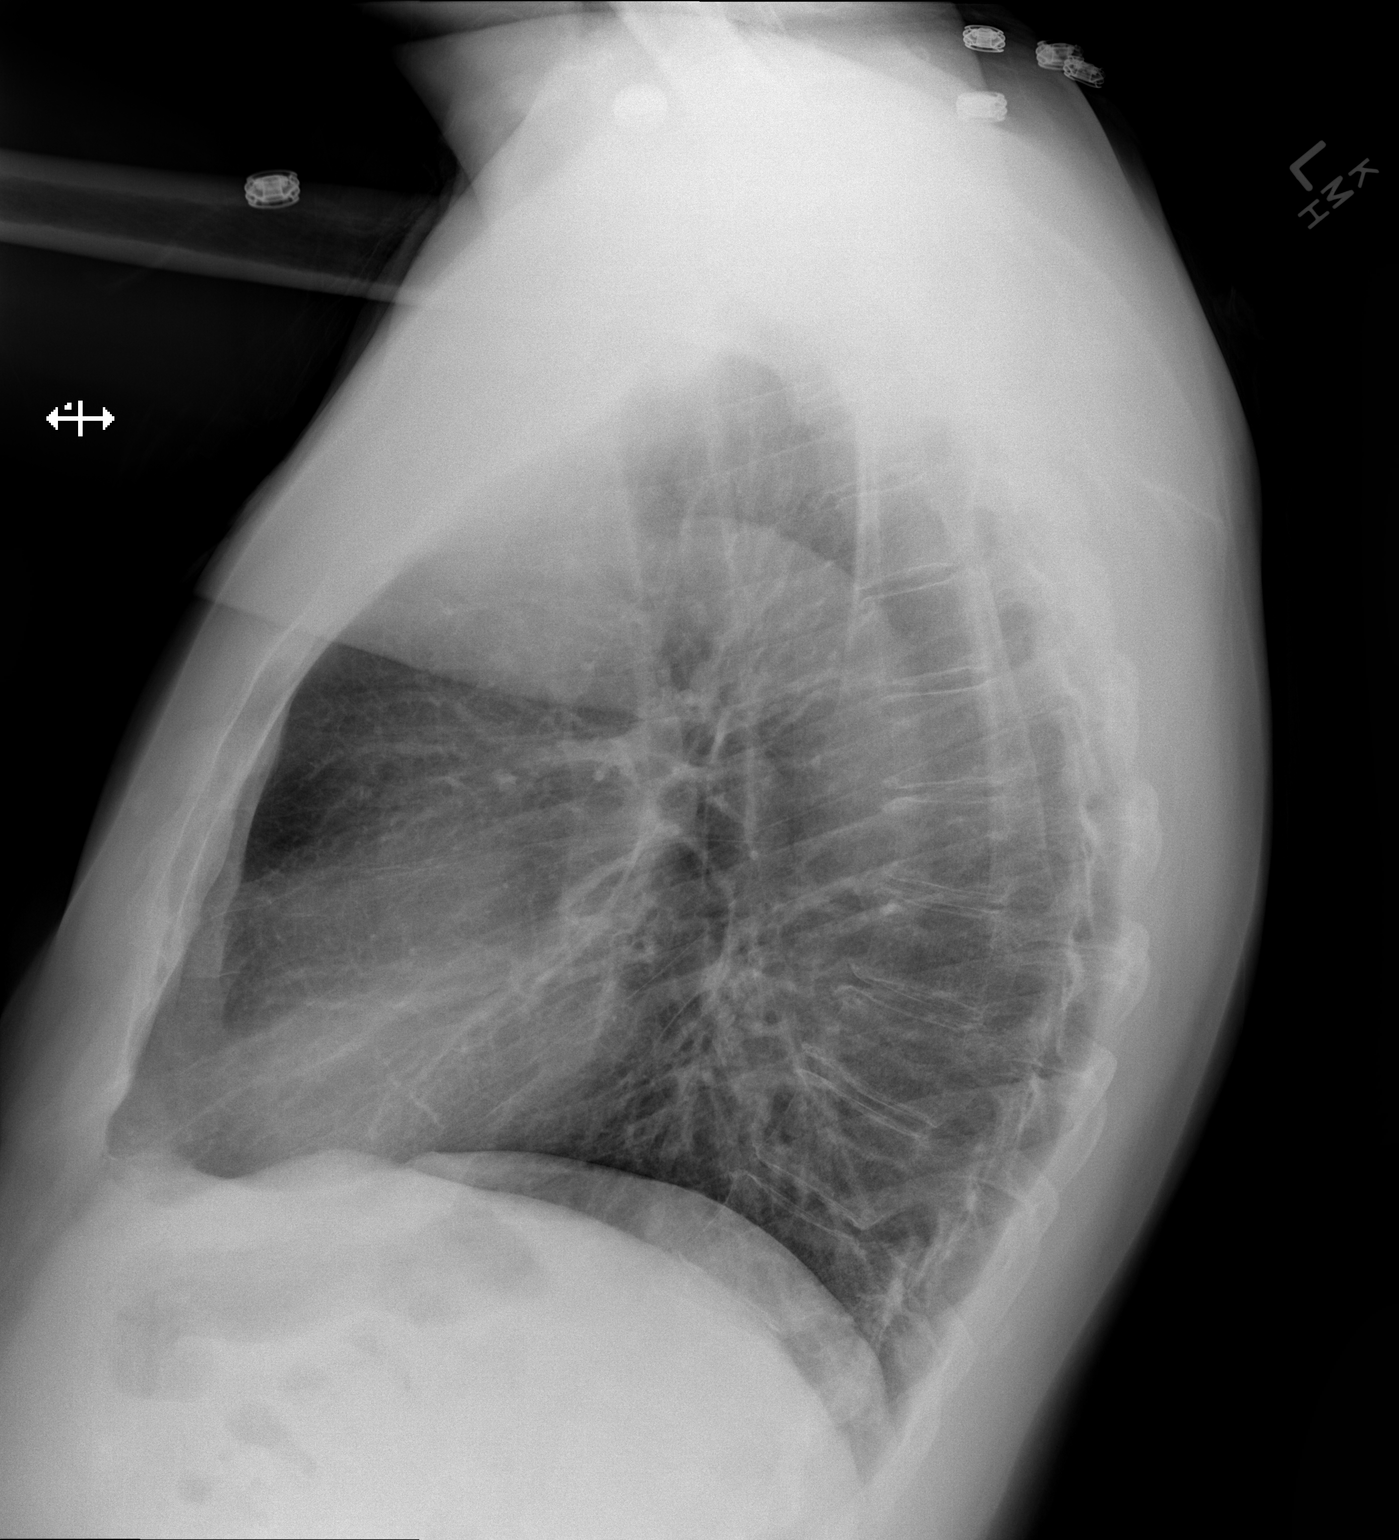

[2 of 2 positions shown; findings below may reference images not displayed]

FINDINGS: Cardiomediastinal silhouette projects within normal limits in size
and contour. No confluent airspace disease, pneumothorax, or pleural
effusion.

No displaced fracture.

Unremarkable appearance of the upper abdomen.
IMPRESSION: No radiographic evidence of acute cardiopulmonary disease.
